# Patient Record
Sex: Female | Born: 1960 | Race: Black or African American | Hispanic: No | Marital: Married | Smoking: Never smoker
Health system: Southern US, Community
[De-identification: ages and names within clinical notes are randomized; demographics above are authoritative.]

## PROBLEM LIST (undated history)

## (undated) DIAGNOSIS — I10 Essential (primary) hypertension: Secondary | ICD-10-CM

## (undated) DIAGNOSIS — E785 Hyperlipidemia, unspecified: Secondary | ICD-10-CM

## (undated) HISTORY — PX: BRAIN SURGERY: SHX531

## (undated) HISTORY — PX: INNER EAR SURGERY: SHX679

---

## 1999-02-22 ENCOUNTER — Encounter: Payer: Self-pay | Admitting: Emergency Medicine

## 1999-02-22 ENCOUNTER — Inpatient Hospital Stay (HOSPITAL_COMMUNITY): Admission: EM | Admit: 1999-02-22 | Discharge: 1999-02-27 | Payer: Self-pay | Admitting: Emergency Medicine

## 1999-02-22 ENCOUNTER — Encounter: Payer: Self-pay | Admitting: Neurosurgery

## 1999-02-23 ENCOUNTER — Encounter: Payer: Self-pay | Admitting: Neurosurgery

## 1999-07-30 ENCOUNTER — Encounter: Payer: Self-pay | Admitting: Neurosurgery

## 1999-07-30 ENCOUNTER — Ambulatory Visit (HOSPITAL_COMMUNITY): Admission: RE | Admit: 1999-07-30 | Discharge: 1999-07-30 | Payer: Self-pay | Admitting: Neurosurgery

## 2000-02-04 ENCOUNTER — Ambulatory Visit (HOSPITAL_COMMUNITY): Admission: RE | Admit: 2000-02-04 | Discharge: 2000-02-04 | Payer: Self-pay | Admitting: Neurosurgery

## 2000-02-04 ENCOUNTER — Encounter: Payer: Self-pay | Admitting: Neurosurgery

## 2000-02-08 ENCOUNTER — Ambulatory Visit (HOSPITAL_COMMUNITY): Admission: RE | Admit: 2000-02-08 | Discharge: 2000-02-08 | Payer: Self-pay | Admitting: Neurosurgery

## 2000-02-08 ENCOUNTER — Encounter: Payer: Self-pay | Admitting: Neurosurgery

## 2000-03-24 ENCOUNTER — Encounter: Payer: Self-pay | Admitting: Neurosurgery

## 2000-03-26 ENCOUNTER — Encounter: Payer: Self-pay | Admitting: *Deleted

## 2000-03-26 ENCOUNTER — Inpatient Hospital Stay (HOSPITAL_COMMUNITY): Admission: RE | Admit: 2000-03-26 | Discharge: 2000-03-30 | Payer: Self-pay | Admitting: Neurosurgery

## 2006-11-13 ENCOUNTER — Ambulatory Visit: Payer: Self-pay | Admitting: Family Medicine

## 2006-11-20 ENCOUNTER — Ambulatory Visit: Payer: Self-pay | Admitting: Family Medicine

## 2007-12-30 ENCOUNTER — Ambulatory Visit: Payer: Self-pay | Admitting: Family Medicine

## 2011-05-20 ENCOUNTER — Emergency Department: Payer: Self-pay | Admitting: Emergency Medicine

## 2012-09-09 ENCOUNTER — Ambulatory Visit: Payer: Self-pay | Admitting: Family Medicine

## 2013-11-27 ENCOUNTER — Emergency Department: Payer: Self-pay | Admitting: Emergency Medicine

## 2014-10-24 ENCOUNTER — Ambulatory Visit: Payer: Self-pay | Admitting: Family Medicine

## 2016-05-25 ENCOUNTER — Encounter: Payer: Self-pay | Admitting: Emergency Medicine

## 2016-05-25 ENCOUNTER — Emergency Department
Admission: EM | Admit: 2016-05-25 | Discharge: 2016-05-25 | Disposition: A | Discharge status: Home / Self Care | Payer: Medicare Other | Attending: Emergency Medicine | Admitting: Emergency Medicine

## 2016-05-25 DIAGNOSIS — Y999 Unspecified external cause status: Secondary | ICD-10-CM | POA: Insufficient documentation

## 2016-05-25 DIAGNOSIS — L509 Urticaria, unspecified: Secondary | ICD-10-CM | POA: Diagnosis not present

## 2016-05-25 DIAGNOSIS — I10 Essential (primary) hypertension: Secondary | ICD-10-CM | POA: Insufficient documentation

## 2016-05-25 DIAGNOSIS — W57XXXA Bitten or stung by nonvenomous insect and other nonvenomous arthropods, initial encounter: Secondary | ICD-10-CM | POA: Insufficient documentation

## 2016-05-25 DIAGNOSIS — E785 Hyperlipidemia, unspecified: Secondary | ICD-10-CM | POA: Diagnosis not present

## 2016-05-25 DIAGNOSIS — Y929 Unspecified place or not applicable: Secondary | ICD-10-CM | POA: Diagnosis not present

## 2016-05-25 DIAGNOSIS — Y939 Activity, unspecified: Secondary | ICD-10-CM | POA: Diagnosis not present

## 2016-05-25 DIAGNOSIS — R21 Rash and other nonspecific skin eruption: Secondary | ICD-10-CM | POA: Diagnosis present

## 2016-05-25 HISTORY — DX: Essential (primary) hypertension: I10

## 2016-05-25 HISTORY — DX: Hyperlipidemia, unspecified: E78.5

## 2016-05-25 MED ORDER — FAMOTIDINE 20 MG PO TABS
40.0000 mg | ORAL_TABLET | Freq: Once | ORAL | Status: AC
  Administered 2016-05-25: 40 mg via ORAL
  Filled 2016-05-25: qty 2

## 2016-05-25 MED ORDER — PREDNISONE 20 MG PO TABS: 60.0000 mg | ORAL_TABLET | Freq: Every day | ORAL | Status: DC

## 2016-05-25 MED ORDER — HYDROXYZINE PAMOATE 25 MG PO CAPS: 25.0000 mg | ORAL_CAPSULE | Freq: Three times a day (TID) | ORAL | Status: DC | PRN

## 2016-05-25 MED ORDER — PREDNISONE 20 MG PO TABS
60.0000 mg | ORAL_TABLET | Freq: Once | ORAL | Status: AC
  Administered 2016-05-25: 60 mg via ORAL
  Filled 2016-05-25: qty 3

## 2016-05-25 MED ORDER — HYDROXYZINE HCL 25 MG PO TABS
25.0000 mg | ORAL_TABLET | Freq: Once | ORAL | Status: AC
  Administered 2016-05-25: 25 mg via ORAL
  Filled 2016-05-25: qty 1

## 2016-05-25 NOTE — ED Notes (Signed)
Pt reports on Thursday she placed cucumbers on her eyes and they became red and irritated and started itching. On Friday pt rpeorts she went to the dumpster to throw out trash and when she came back she noticed a rash on her arms and trunk. Pt has red raised rash to to both arms and abd.

## 2016-05-25 NOTE — Discharge Instructions (Signed)
Hives Hives are itchy, red, swollen areas of the skin. They can vary in size and location on your body. Hives can come and go for hours or several days (acute hives) or for several weeks (chronic hives). Hives do not spread from person to person (noncontagious). They may get worse with scratching, exercise, and emotional stress. CAUSES   Allergic reaction to food, additives, or drugs.  Infections, including the common cold.  Illness, such as vasculitis, lupus, or thyroid disease.  Exposure to sunlight, heat, or cold.  Exercise.  Stress.  Contact with chemicals. SYMPTOMS   Red or white swollen patches on the skin. The patches may change size, shape, and location quickly and repeatedly.  Itching.  Swelling of the hands, feet, and face. This may occur if hives develop deeper in the skin. DIAGNOSIS  Your caregiver can usually tell what is wrong by performing a physical exam. Skin or blood tests may also be done to determine the cause of your hives. In some cases, the cause cannot be determined. TREATMENT  Mild cases usually get better with medicines such as antihistamines. Severe cases may require an emergency epinephrine injection. If the cause of your hives is known, treatment includes avoiding that trigger.  HOME CARE INSTRUCTIONS   Avoid causes that trigger your hives.  Take antihistamines as directed by your caregiver to reduce the severity of your hives. Non-sedating or low-sedating antihistamines are usually recommended. Do not drive while taking an antihistamine.  Take any other medicines prescribed for itching as directed by your caregiver.  Wear loose-fitting clothing.  Keep all follow-up appointments as directed by your caregiver. SEEK MEDICAL CARE IF:   You have persistent or severe itching that is not relieved with medicine.  You have painful or swollen joints. SEEK IMMEDIATE MEDICAL CARE IF:   You have a fever.  Your tongue or lips are swollen.  You have  trouble breathing or swallowing.  You feel tightness in the throat or chest.  You have abdominal pain. These problems may be the first sign of a life-threatening allergic reaction. Call your local emergency services (911 in [place] MAKE SURE YOU:   Understand these instructions.  Will watch your condition.  Will get help right away if you are not doing well or get worse.   This information is not intended to replace advice given to you by your health care provider. Make sure you discuss any questions you have with your health care provider.   Document Released: 11/25/2005 Document Revised: 11/30/2013 Document Reviewed: 02/18/2012 Elsevier Interactive Patient Education 2016 Elsevier Inc.  

## 2016-05-25 NOTE — ED Provider Notes (Signed)
[facility] Emergency Department Provider Note   ____________________________________________  Time seen: Approximately 643 AM  I have reviewed the triage vital signs and the nursing notes.   HISTORY  Chief Complaint Insect Bite    HPI Alicia Chambers is a 55 y.o. female who comes into the hospital today with a rash. The patient reports that she put cucumbers on her eyes and they became red and swollen around the areas. She reports that this occurred on Thursday. She reports that she bought some eye drops yesterday and her eyes felt better. She reports though that last night she went to take her trash out to the dumpster. She reports that her window was open and she is unsure if she was bitten by anything but when she returned home she noticed that she had a rash on her arm. The patient took some Benadryl. She reports that she took a dose at midnight and then another one at 4 AM. She reports that she came in because the rash seemed to look different. The patient couldn't explain exactly how but she said it looked different and more like a rash that she decided to come in and find out what was going on. The patient reports a year ago she put cucumbers around her eyes and she did break out but she thought it was due to the skin on the cucumbers. She reports that this time she peeled the skin but she still developed this rash. The patient reports that she's never eaten cucumbers in the past.Patient denies any itching, denies any throat swelling shortness of breath or scratchiness in her throat.   Past Medical History  Diagnosis Date  . Hypertension   . Hyperlipidemia     There are no active problems to display for this patient.   History reviewed. No pertinent past surgical history.  current outpatient prescriptions: The patient does not know the names of her medications.   Allergies Review of patient's allergies indicates no known allergies.  No family  history on file.  Social History Social History  Substance Use Topics  . Smoking status: Never Smoker   . Smokeless tobacco: None  . Alcohol Use: No    Review of Systems Constitutional: No fever/chills Eyes: No visual changes. ENT: No sore throat. Cardiovascular: Denies chest pain. Respiratory: Denies shortness of breath. Gastrointestinal: No abdominal pain.  No nausea, no vomiting.  No diarrhea.  No constipation. Genitourinary: Negative for dysuria. Musculoskeletal: Negative for back pain. Skin:  rash. Neurological: Negative for headaches, focal weakness or numbness.  10-point ROS otherwise negative.  ____________________________________________   PHYSICAL EXAM:  VITAL SIGNS: ED Triage Vitals  Enc Vitals Group     BP 05/25/16 0622 157/97 mmHg     Pulse Rate 05/25/16 0622 92     Resp 05/25/16 0622 18     Temp 05/25/16 0622 99.1 F (37.3 C)     Temp Source 05/25/16 0622 Oral     SpO2 05/25/16 0622 98 %     Weight 05/25/16 0622 190 lb (86.183 kg)     Height 05/25/16 0622  (1.626 m)     Head Cir --      Peak Flow --      Pain Score 05/25/16 0623 0     Pain Loc --      Pain Edu? --      Excl. in GC? --     Constitutional: Alert and oriented. Well appearing and in Mild distress. Eyes: Conjunctivae  are normal. PERRL. EOMI. Head: Atraumatic. Nose: No congestion/rhinnorhea. Mouth/Throat: Mucous membranes are moist.  Oropharynx non-erythematous. Cardiovascular: Normal rate, regular rhythm. Grossly normal heart sounds.  Good peripheral circulation. Respiratory: Normal respiratory effort.  No retractions. Lungs CTAB. Gastrointestinal: Soft and nontender. No distention. Positive bowel sounds Musculoskeletal: No lower extremity tenderness nor edema.   Neurologic:  Normal speech and language. Skin:  Hives to the patient's bilateral arms and abdomen. Patient also has some scattered hives on her bilateral legs. Patient not itching at this moment. Psychiatric: Mood and  affect are normal.   ____________________________________________   LABS (all labs ordered are listed, but only abnormal results are displayed)  Labs Reviewed - No data to display ____________________________________________  EKG  none ____________________________________________  RADIOLOGY  none ____________________________________________   PROCEDURES  Procedure(s) performed: None  Critical Care performed: No  ____________________________________________   INITIAL IMPRESSION / ASSESSMENT AND PLAN / ED COURSE  Pertinent labs & imaging results that were available during my care of the patient were reviewed by me and considered in my medical decision making (see chart for details).  This is a 55 year old female who comes into the hospital today with hives to her bilateral arms. The patient reports that she used some cucumbers on her eyes the other day which is what seemed to cause the reaction. The patient was unsure if she may have been bitten by a bug but she does have hives all over her body. I will give the patient a dose of prednisone as well as some Pepcid and some hydroxyzine. The patient appears comfortable at this time. I will discharge her with some medication and have her follow-up with her primary care physician. The patient was concerned she may not be able to be around people which I informed her that this is not a contagious rash. The patient has no further questions or concerns. ____________________________________________   FINAL CLINICAL IMPRESSION(S) / ED DIAGNOSES  Final diagnoses:  Hives      NEW MEDICATIONS STARTED DURING THIS VISIT:  New Prescriptions   HYDROXYZINE (VISTARIL) 25 MG CAPSULE    Take 1 capsule (25 mg total) by mouth 3 (three) times daily as needed.   PREDNISONE (DELTASONE) 20 MG TABLET    Take 3 tablets (60 mg total) by mouth daily.     Note:  This document was prepared using Dragon voice recognition software and may include  unintentional dictation errors.    Rebecka ApleyAllison P Walid Haig, MD 05/25/16 [phone]

## 2016-05-31 ENCOUNTER — Encounter: Payer: Self-pay | Admitting: Emergency Medicine

## 2016-05-31 ENCOUNTER — Emergency Department
Admission: EM | Admit: 2016-05-31 | Discharge: 2016-05-31 | Disposition: A | Discharge status: Home / Self Care | Payer: Medicare Other | Attending: Emergency Medicine | Admitting: Emergency Medicine

## 2016-05-31 DIAGNOSIS — E785 Hyperlipidemia, unspecified: Secondary | ICD-10-CM | POA: Insufficient documentation

## 2016-05-31 DIAGNOSIS — I10 Essential (primary) hypertension: Secondary | ICD-10-CM | POA: Insufficient documentation

## 2016-05-31 DIAGNOSIS — L509 Urticaria, unspecified: Secondary | ICD-10-CM | POA: Insufficient documentation

## 2016-05-31 DIAGNOSIS — R21 Rash and other nonspecific skin eruption: Secondary | ICD-10-CM | POA: Diagnosis present

## 2016-05-31 MED ORDER — HYDROXYZINE PAMOATE 25 MG PO CAPS: 25.0000 mg | ORAL_CAPSULE | Freq: Three times a day (TID) | ORAL | Status: DC | PRN

## 2016-05-31 MED ORDER — HYDROXYZINE HCL 25 MG PO TABS
25.0000 mg | ORAL_TABLET | Freq: Once | ORAL | Status: AC
  Administered 2016-05-31: 25 mg via ORAL
  Filled 2016-05-31: qty 1

## 2016-05-31 NOTE — ED Notes (Addendum)
Pt was seen here last week for hives. Pt states she thinks rash stems from putting a cucumber on her eye, eye turned red, then broke out in rash. Pt D/C with prescriptions for pepcid/ prednisone, hydroxysine. Pt states she took last dose today and hives started back. Pt denies itching. States rash is on arms, little bit on stomach, and some on legs. States "I shoulda made that doctors appt" [facility] on D/C papers, but states she wants to see [place] Dr in [place].

## 2016-05-31 NOTE — ED Notes (Signed)
Patient states that she was seen here last week for hives. Patient states that she was prescribed medication and that the hives were improving. Patient states that this evening she noticed them starting to get worse again.

## 2016-05-31 NOTE — Discharge Instructions (Signed)
Hives Hives are itchy, red, swollen areas of the skin. They can vary in size and location on your body. Hives can come and go for hours or several days (acute hives) or for several weeks (chronic hives). Hives do not spread from person to person (noncontagious). They may get worse with scratching, exercise, and emotional stress. CAUSES   Allergic reaction to food, additives, or drugs.  Infections, including the common cold.  Illness, such as vasculitis, lupus, or thyroid disease.  Exposure to sunlight, heat, or cold.  Exercise.  Stress.  Contact with chemicals. SYMPTOMS   Red or white swollen patches on the skin. The patches may change size, shape, and location quickly and repeatedly.  Itching.  Swelling of the hands, feet, and face. This may occur if hives develop deeper in the skin. DIAGNOSIS  Your caregiver can usually tell what is wrong by performing a physical exam. Skin or blood tests may also be done to determine the cause of your hives. In some cases, the cause cannot be determined. TREATMENT  Mild cases usually get better with medicines such as antihistamines. Severe cases may require an emergency epinephrine injection. If the cause of your hives is known, treatment includes avoiding that trigger.  HOME CARE INSTRUCTIONS   Avoid causes that trigger your hives.  Take antihistamines as directed by your caregiver to reduce the severity of your hives. Non-sedating or low-sedating antihistamines are usually recommended. Do not drive while taking an antihistamine.  Take any other medicines prescribed for itching as directed by your caregiver.  Wear loose-fitting clothing.  Keep all follow-up appointments as directed by your caregiver. SEEK MEDICAL CARE IF:   You have persistent or severe itching that is not relieved with medicine.  You have painful or swollen joints. SEEK IMMEDIATE MEDICAL CARE IF:   You have a fever.  Your tongue or lips are swollen.  You have  trouble breathing or swallowing.  You feel tightness in the throat or chest.  You have abdominal pain. These problems may be the first sign of a life-threatening allergic reaction. Call your local emergency services (911 in [place] MAKE SURE YOU:   Understand these instructions.  Will watch your condition.  Will get help right away if you are not doing well or get worse.   This information is not intended to replace advice given to you by your health care provider. Make sure you discuss any questions you have with your health care provider.   Document Released: 11/25/2005 Document Revised: 11/30/2013 Document Reviewed: 02/18/2012 Elsevier Interactive Patient Education [employer]2016 Elsevier Inc.  Please take hydroxyzine as prescribed, avoid hot showers.

## 2016-05-31 NOTE — ED Provider Notes (Signed)
CSN: [id number]     Arrival date & time 05/31/16  2039 History   First MD Initiated Contact with Patient 05/31/16 2053     Chief Complaint  Patient presents with  . Urticaria     (Consider location/radiation/quality/duration/timing/severity/associated sxs/prior Treatment) HPI  55 year old female presents to emergency department for evaluation of skin rash to left and right upper arms. Mild rash to the abdomen. Rash is mildly pruritic. Rash began 1 hour ago. She denies any new contacts to soaps, detergents or fabrics. No new foods. She denies any facial swelling chest pain shortness of breath. Patient was seen 6 days ago diagnosed with hives, given steroids and Vistaril which helped significantly. She denies any tick bites joint aches, fevers. Patient states she has been extremely stressed. She is currently not taking any medications for her rash.  Past Medical History  Diagnosis Date  . Hypertension   . Hyperlipidemia    Past Surgical History  Procedure Laterality Date  . Brain surgery    . Inner ear surgery     No family history on file. Social History  Substance Use Topics  . Smoking status: Never Smoker   . Smokeless tobacco: None  . Alcohol Use: No   OB History    No data available     Review of Systems  Constitutional: Negative for fever, chills, activity change and fatigue.  HENT: Negative for congestion, sinus pressure and sore throat.   Eyes: Negative for visual disturbance.  Respiratory: Negative for cough, chest tightness and shortness of breath.   Cardiovascular: Negative for chest pain and leg swelling.  Gastrointestinal: Negative for nausea, vomiting, abdominal pain and diarrhea.  Genitourinary: Negative for dysuria.  Musculoskeletal: Negative for arthralgias and gait problem.  Skin: Positive for rash.  Neurological: Negative for weakness, numbness and headaches.  Hematological: Negative for adenopathy.  Psychiatric/Behavioral: Negative for behavioral  problems, confusion and agitation.      Allergies  Cucumber extract  Home Medications   Prior to Admission medications   Medication Sig Start Date End Date Taking? Authorizing Provider  hydrOXYzine (VISTARIL) 25 MG capsule Take 1 capsule (25 mg total) by mouth 3 (three) times daily as needed. 05/31/16   Evon Slackhomas C Gaines, PA-C  predniSONE (DELTASONE) 20 MG tablet Take 3 tablets (60 mg total) by mouth daily. 05/25/16   Rebecka ApleyAllison P Webster, MD   BP 154/85 mmHg  Pulse 83  Temp(Src) 98 F (36.7 C)  Resp 18  Ht 5\' 4"  (1.626 m)  Wt 86.183 kg  BMI 32.60 kg/m2  SpO2 100% Physical Exam  Constitutional: She is oriented to person, place, and time. She appears well-developed and well-nourished. No distress.  HENT:  Head: Normocephalic and atraumatic.  Right Ear: External ear normal.  Left Ear: External ear normal.  Nose: Nose normal.  Mouth/Throat: Oropharynx is clear and moist. No oropharyngeal exudate.  No facial edema  Eyes: EOM are normal. Pupils are equal, round, and reactive to light. Right eye exhibits no discharge. Left eye exhibits no discharge.  Neck: Normal range of motion. Neck supple.  Cardiovascular: Normal rate, regular rhythm and intact distal pulses.   Pulmonary/Chest: Effort normal and breath sounds normal. No respiratory distress. She has no wheezes. She has no rales. She exhibits no tenderness.  Abdominal: Soft. She exhibits no distension. There is no tenderness.  Musculoskeletal: Normal range of motion. She exhibits no edema.  Neurological: She is alert and oriented to person, place, and time. She has normal reflexes.  Skin: Skin is warm  and dry. Rash (patient with urticarial type rash with raised macular erythematous lesions along the upper arms mostly, minimal erythema to the abdomen. No open sores or lesions. No warmth.) noted.  Psychiatric: She has a normal mood and affect. Her behavior is normal. Thought content normal.    ED Course  Procedures (including critical  care time) Labs Review Labs Reviewed - No data to display  Imaging Review No results found. I have personally reviewed and evaluated these images and lab results as part of my medical decision-making.   EKG Interpretation None      MDM   Final diagnoses:  Urticaria    55 year old female with continued rash, no signs of allergic reaction. Rash consistent with hives/urticaria. She will continue with Vistaril, was given a prescription for this today. She will follow-up with PCP. No signs of tick exposure or tick born illness. She is educated on signs and symptoms to return to the ED for.  Evon Slackhomas C Gaines, PA-C 05/31/16 2110  Jeanmarie PlantJames A McShane, MD 05/31/16 2300

## 2016-06-19 DIAGNOSIS — E785 Hyperlipidemia, unspecified: Secondary | ICD-10-CM | POA: Insufficient documentation

## 2016-06-19 DIAGNOSIS — I10 Essential (primary) hypertension: Secondary | ICD-10-CM | POA: Insufficient documentation

## 2016-06-19 DIAGNOSIS — R21 Rash and other nonspecific skin eruption: Secondary | ICD-10-CM | POA: Diagnosis not present

## 2016-06-19 DIAGNOSIS — Z7952 Long term (current) use of systemic steroids: Secondary | ICD-10-CM | POA: Diagnosis not present

## 2016-06-20 ENCOUNTER — Encounter: Payer: Self-pay | Admitting: *Deleted

## 2016-06-20 ENCOUNTER — Emergency Department
Admission: EM | Admit: 2016-06-20 | Discharge: 2016-06-20 | Disposition: A | Discharge status: Home / Self Care | Payer: Medicare Other | Attending: Emergency Medicine | Admitting: Emergency Medicine

## 2016-06-20 DIAGNOSIS — R21 Rash and other nonspecific skin eruption: Secondary | ICD-10-CM

## 2016-06-20 NOTE — ED Notes (Signed)
Pt to ED via POV requesting to see an MD for a second opinion. 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 Pt states "I just don't know I want a blood test to confirm it and see another doctor because I just don't believe it" Pt tearful in triage, vitals stable, NAD noted.

## 2016-06-20 NOTE — ED Notes (Signed)
Pt discharged to home.  Discharge instructions reviewed.  Verbalized understanding.  No questions or concerns at this time.  Teach back verified.  Pt in NAD.  No items left in ED.   

## 2016-06-20 NOTE — Discharge Instructions (Signed)
As we discussed you might benefit from seeing a dermatologist. Please discuss this with your doctor. Please seek medical attention for any high fevers, chest pain, shortness of breath, change in behavior, persistent vomiting, bloody stool or any other new or concerning symptoms.

## 2016-06-20 NOTE — ED Provider Notes (Signed)
[facility] Emergency Department Provider Note   ____________________________________________  Time seen: ~0250  I have reviewed the triage vital signs and the nursing notes.   HISTORY  Chief Complaint Rash   History limited by: Not Limited   HPI Stephany Poorman is a 55 y.o. female who presents to the emergency department today because of desire to get a second opinion on a rash. The rash is located at the top of her intergluteal cleft. She denies that this is painful. States she has never had pain there. Her primary care doctor today told her that he thought it was likely herpes.   Past Medical History  Diagnosis Date  . Hypertension   . Hyperlipidemia     There are no active problems to display for this patient.   Past Surgical History  Procedure Laterality Date  . Brain surgery    . Inner ear surgery      Current Outpatient Rx  Name  Route  Sig  Dispense  Refill  . hydrOXYzine (VISTARIL) 25 MG capsule   Oral   Take 1 capsule (25 mg total) by mouth 3 (three) times daily as needed.   30 capsule   0   . predniSONE (DELTASONE) 20 MG tablet   Oral   Take 3 tablets (60 mg total) by mouth daily.   12 tablet   0     Allergies Cucumber extract  History reviewed. No pertinent family history.  Social History Social History  Substance Use Topics  . Smoking status: Never Smoker   . Smokeless tobacco: None  . Alcohol Use: No    Review of Systems  Constitutional: Negative for fever. Cardiovascular: Negative for chest pain. Respiratory: Negative for shortness of breath. Gastrointestinal: Negative for abdominal pain, vomiting and diarrhea. Neurological: Negative for headaches, focal weakness or numbness.  10-point ROS otherwise negative.  ____________________________________________   PHYSICAL EXAM:  VITAL SIGNS: ED Triage Vitals  Enc Vitals Group     BP 06/20/16 0014 160/90 mmHg     Pulse --      Resp 06/20/16 0014 18     Temp 06/20/16 0014 98.6 F (37 C)     Temp Source 06/20/16 0014 Oral     SpO2 06/20/16 0014 98 %     Weight 06/20/16 0014 191 lb (86.637 kg)     Height 06/20/16 0014  (1.626 m)     Head Cir --      Peak Flow --      Pain Score 06/20/16 0015 0   Constitutional: Alert and oriented. Well appearing and in no distress. Eyes: Conjunctivae are normal. PERRL. Normal extraocular movements. ENT   Head: Normocephalic and atraumatic.   Nose: No congestion/rhinnorhea.   Mouth/Throat: Mucous membranes are moist.   Neck: No stridor. Gastrointestinal: Soft and nontender. No distention.  Genitourinary: Deferred Musculoskeletal: Normal range of motion in all extremities. No joint effusions.  No lower extremity tenderness nor edema. Neurologic:  Normal speech and language. No gross focal neurologic deficits are appreciated.  Skin:  Skin is warm, dry and intact. A couple small vesicles noted at the time of her intergluteal fold. No surrounding erythema. Nontender to palpation. Psychiatric: Mood and affect are normal. Speech and behavior are normal. Patient exhibits appropriate insight and judgment.  ____________________________________________    LABS (pertinent positives/negatives)  None  ____________________________________________   EKG  None  ____________________________________________    RADIOLOGY  None  ____________________________________________   PROCEDURES  Procedure(s) performed: None  Critical Care  performed: No  ____________________________________________   INITIAL IMPRESSION / ASSESSMENT AND PLAN / ED COURSE  Pertinent labs & imaging results that were available during my care of the patient were reviewed by me and considered in my medical decision making (see chart for details).  Patient presented to the emergency department today because of desire to get a second opinion of a rash in her intergluteal fold. On exam she did have a  comfortable small vascular type lesions the top of her intergluteal fold however didn't appreciate any surrounding erythema and it was nontender to palpation. This point I would have a hard time: Herpes given that it is completely pain-free. Did discuss with patient that she would likely benefit from dermatology referral. He states that she will talk to her primary care doctor about obtaining dermatology referral.  ____________________________________________   FINAL CLINICAL IMPRESSION(S) / ED DIAGNOSES  Final diagnoses:  Rash     Note: This dictation was prepared with Dragon dictation. Any transcriptional errors that result from this process are unintentional    Phineas SemenGraydon Mare Ludtke, MD 06/20/16 [phone]

## 2017-03-03 ENCOUNTER — Other Ambulatory Visit: Payer: Self-pay | Admitting: Family Medicine

## 2017-03-03 DIAGNOSIS — Z1231 Encounter for screening mammogram for malignant neoplasm of breast: Secondary | ICD-10-CM

## 2017-03-31 ENCOUNTER — Ambulatory Visit
Admission: RE | Admit: 2017-03-31 | Discharge: 2017-03-31 | Disposition: A | Discharge status: Home / Self Care | Payer: Medicare Other | Source: Ambulatory Visit | Attending: Family Medicine | Admitting: Family Medicine

## 2017-03-31 DIAGNOSIS — Z1231 Encounter for screening mammogram for malignant neoplasm of breast: Secondary | ICD-10-CM

## 2017-06-23 IMAGING — MG MM DIGITAL SCREENING BILAT W/ CAD
5 series · 5 of 5 positions shown · non-contrast
Comparison: Previous exam(s).

CLINICAL DATA: Screening.

EXAM:
DIGITAL SCREENING BILATERAL MAMMOGRAM WITH CAD

[L MLO]
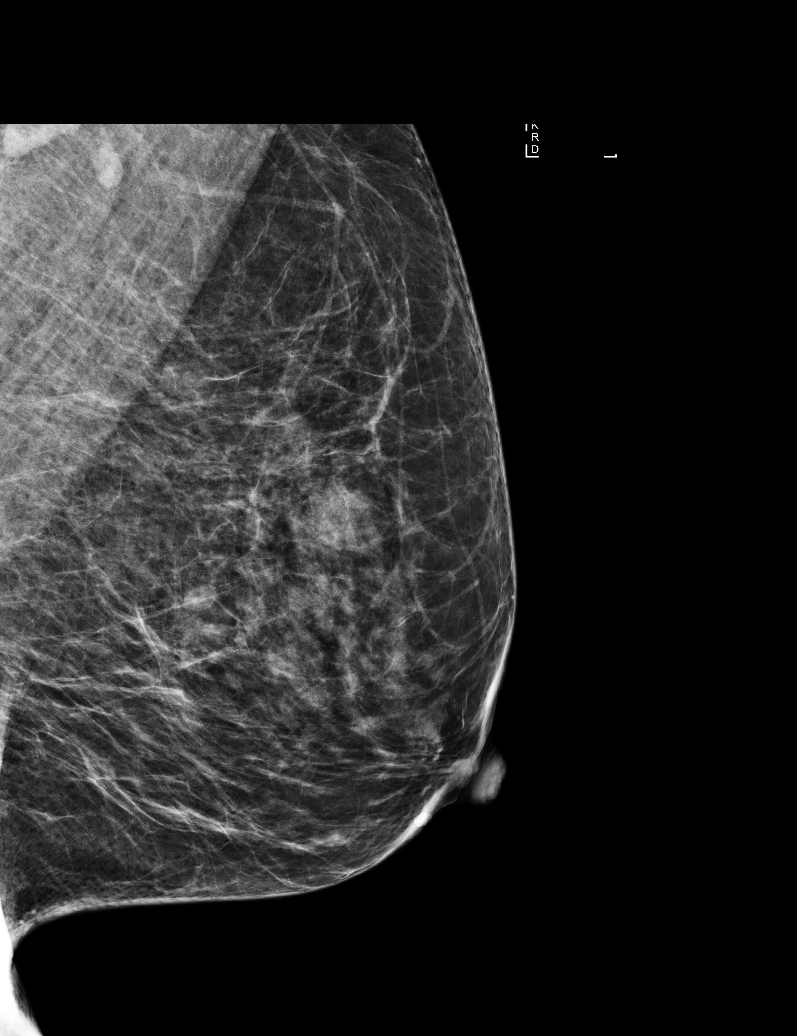

[R MLO]
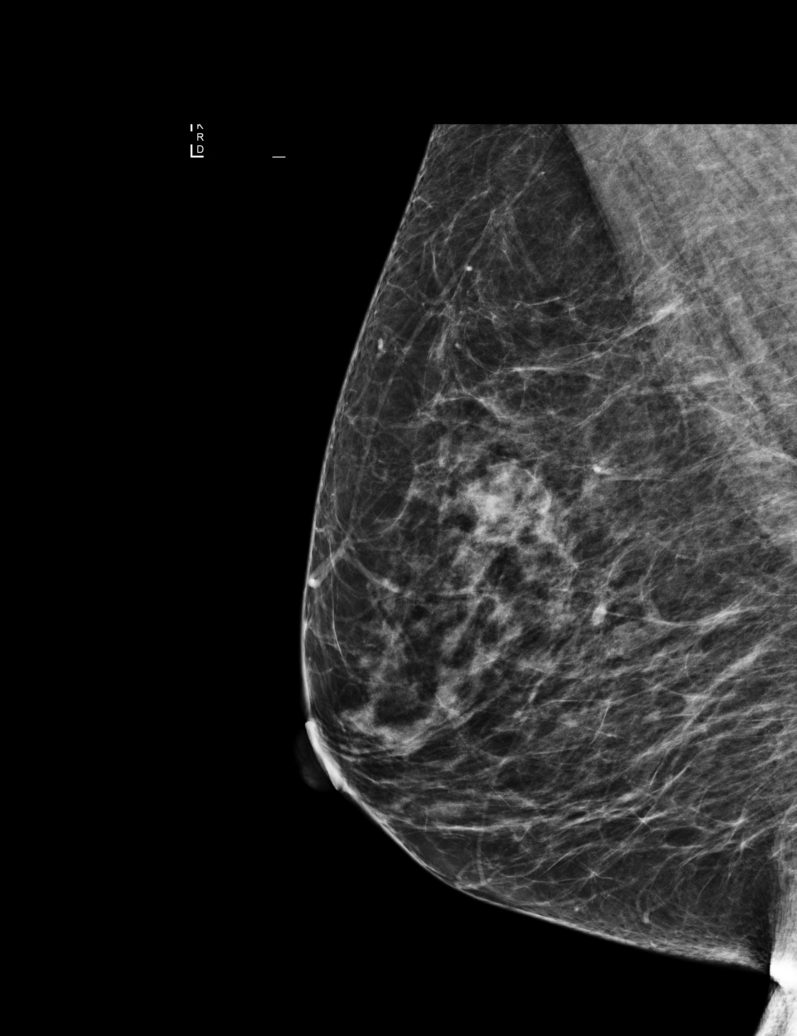

[R CC (1 of 2)]
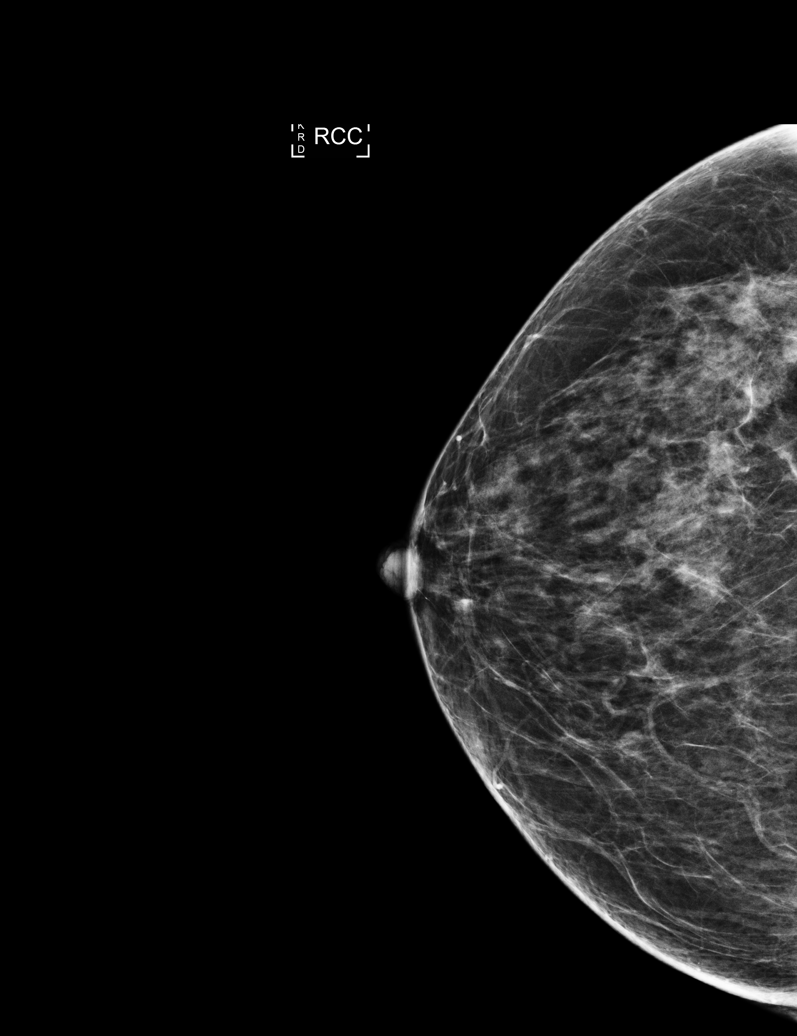

[L CC]
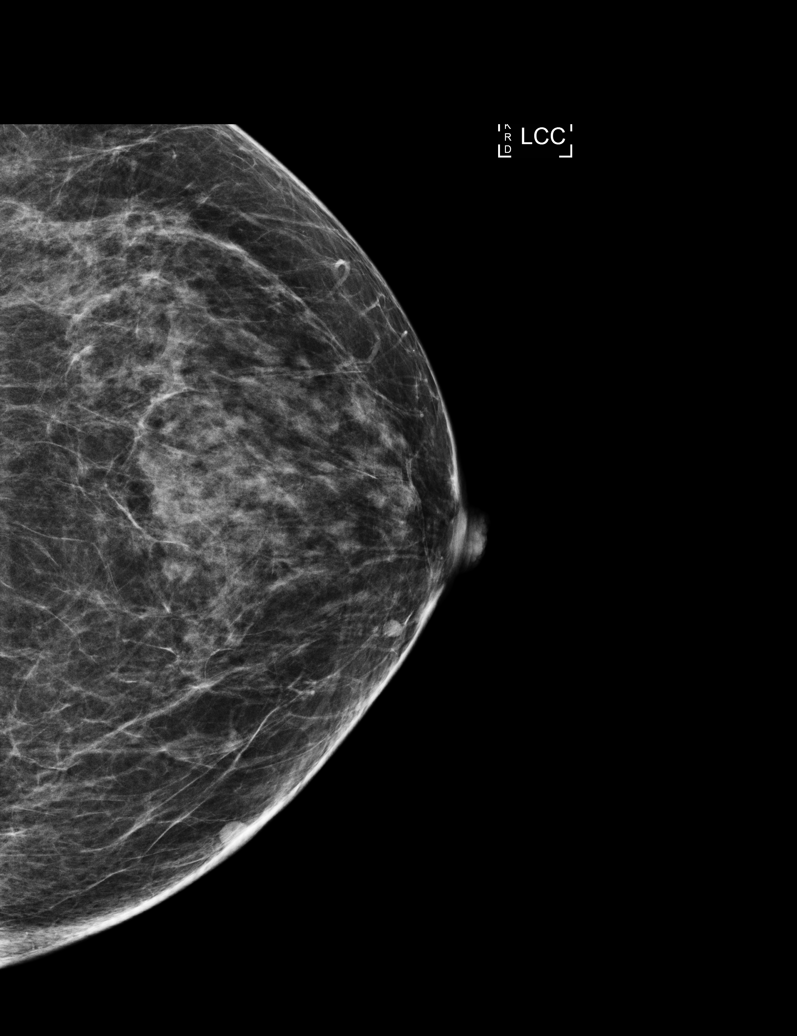

[R CC (2 of 2)]
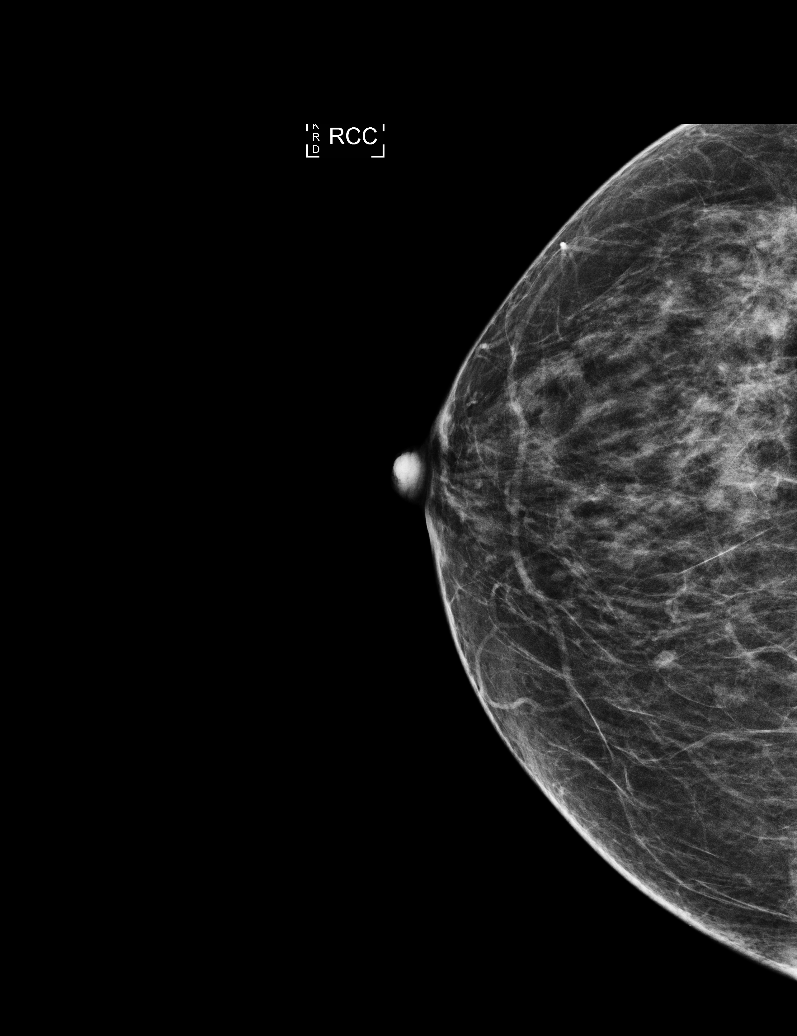

[5 of 5 positions shown; findings below may reference images not displayed]

ACR Breast Density Category b: There are scattered areas of
fibroglandular density.
FINDINGS: There are no findings suspicious for malignancy. Images were
processed with CAD.
IMPRESSION: No mammographic evidence of malignancy. A result letter of this
screening mammogram will be mailed directly to the patient.

RECOMMENDATION:
Screening mammogram in one year. (Code:AS-G-LCT)

BI-RADS CATEGORY  1: Negative.

## 2017-06-24 ENCOUNTER — Encounter: Payer: Self-pay | Admitting: Emergency Medicine

## 2017-06-24 ENCOUNTER — Emergency Department
Admission: EM | Admit: 2017-06-24 | Discharge: 2017-06-25 | Disposition: A | Discharge status: Home / Self Care | Payer: Medicare Other | Attending: Emergency Medicine | Admitting: Emergency Medicine

## 2017-06-24 DIAGNOSIS — R112 Nausea with vomiting, unspecified: Secondary | ICD-10-CM | POA: Diagnosis not present

## 2017-06-24 DIAGNOSIS — K529 Noninfective gastroenteritis and colitis, unspecified: Secondary | ICD-10-CM | POA: Insufficient documentation

## 2017-06-24 DIAGNOSIS — R109 Unspecified abdominal pain: Secondary | ICD-10-CM | POA: Diagnosis present

## 2017-06-24 DIAGNOSIS — R1010 Upper abdominal pain, unspecified: Secondary | ICD-10-CM | POA: Insufficient documentation

## 2017-06-24 DIAGNOSIS — K5900 Constipation, unspecified: Secondary | ICD-10-CM | POA: Diagnosis not present

## 2017-06-24 DIAGNOSIS — Z79899 Other long term (current) drug therapy: Secondary | ICD-10-CM | POA: Diagnosis not present

## 2017-06-24 DIAGNOSIS — I10 Essential (primary) hypertension: Secondary | ICD-10-CM | POA: Diagnosis not present

## 2017-06-24 LAB — TROPONIN I: Troponin I: <0.03 ng/mL (ref <0.03)

## 2017-06-24 LAB — COMPREHENSIVE METABOLIC PANEL
ALT: 30 U/L (ref 14–54)
ANION GAP: 11 (ref 5–15)
AST: 41 U/L (ref 15–41)
Albumin: 4.7 g/dL (ref 3.5–5.0)
Alkaline Phosphatase: 98 U/L (ref 38–126)
BUN: 13 mg/dL (ref 6–20)
CO2: 25 mmol/L (ref 22–32)
Calcium: 10.6 mg/dL — ABNORMAL HIGH (ref 8.9–10.3)
Chloride: 100 mmol/L — ABNORMAL LOW (ref 101–111)
Creatinine, Ser: 1.13 mg/dL — ABNORMAL HIGH (ref 0.44–1.00)
GFR calc Af Amer: >60 mL/min (ref >60)
GFR calc non Af Amer: 53 mL/min — ABNORMAL LOW (ref >60)
GLUCOSE: 119 mg/dL — AB (ref 65–99)
POTASSIUM: 3.4 mmol/L — AB (ref 3.5–5.1)
SODIUM: 136 mmol/L (ref 135–145)
Total Bilirubin: 0.8 mg/dL (ref 0.3–1.2)
Total Protein: 9.5 g/dL — ABNORMAL HIGH (ref 6.5–8.1)

## 2017-06-24 LAB — CBC
HEMATOCRIT: 43.3 % (ref 35.0–47.0)
HEMOGLOBIN: 14.6 g/dL (ref 12.0–16.0)
MCH: 27.5 pg (ref 26.0–34.0)
MCHC: 33.7 g/dL (ref 32.0–36.0)
MCV: 81.7 fL (ref 80.0–100.0)
Platelets: 319 10*3/uL (ref 150–440)
RBC: 5.31 MIL/uL — ABNORMAL HIGH (ref 3.80–5.20)
RDW: 14.4 % (ref 11.5–14.5)
WBC: 10 10*3/uL (ref 3.6–11.0)

## 2017-06-24 LAB — LIPASE, BLOOD: Lipase: 45 U/L (ref 11–51)

## 2017-06-24 MED ORDER — IOPAMIDOL (ISOVUE-300) INJECTION 61%
30.0000 mL | Freq: Once | INTRAVENOUS | Status: AC | PRN
  Administered 2017-06-24: 30 mL via ORAL

## 2017-06-24 MED ORDER — ONDANSETRON HCL 4 MG/2ML IJ SOLN
4.0000 mg | Freq: Once | INTRAMUSCULAR | Status: AC
  Administered 2017-06-24: 4 mg via INTRAVENOUS
  Filled 2017-06-24: qty 2

## 2017-06-24 MED ORDER — SODIUM CHLORIDE 0.9 % IV BOLUS (SEPSIS)
1000.0000 mL | Freq: Once | INTRAVENOUS | Status: AC
  Administered 2017-06-24: 1000 mL via INTRAVENOUS

## 2017-06-24 NOTE — ED Triage Notes (Signed)
Pt ambulatory to triage with steady gait and guarding stomach. Pt c/o upper abdominal pain since Saturday, worsening today. Pt reports she has not had a BM since Saturday and has tried prune juice and an enema. Pt having continued emesis in triage. Pt taken to [facility].

## 2017-06-25 ENCOUNTER — Emergency Department: Payer: Medicare Other

## 2017-06-25 ENCOUNTER — Encounter: Payer: Self-pay | Admitting: Radiology

## 2017-06-25 LAB — URINALYSIS, COMPLETE (UACMP) WITH MICROSCOPIC
Bacteria, UA: NONE SEEN
Bilirubin Urine: NEGATIVE
GLUCOSE, UA: NEGATIVE mg/dL
Ketones, ur: 5 mg/dL — AB
Leukocytes, UA: NEGATIVE
Nitrite: NEGATIVE
Protein, ur: NEGATIVE mg/dL
Specific Gravity, Urine: >1.046 — ABNORMAL HIGH (ref 1.005–1.030)
pH: 5 (ref 5.0–8.0)

## 2017-06-25 LAB — LACTIC ACID, PLASMA: Lactic Acid, Venous: 1.1 mmol/L (ref 0.5–1.9)

## 2017-06-25 MED ORDER — CIPROFLOXACIN HCL 500 MG PO TABS: 500.0000 mg | ORAL_TABLET | Freq: Two times a day (BID) | ORAL | 0 refills | Status: AC

## 2017-06-25 MED ORDER — CIPROFLOXACIN IN D5W 400 MG/200ML IV SOLN
400.0000 mg | Freq: Once | INTRAVENOUS | Status: AC
  Administered 2017-06-25: 400 mg via INTRAVENOUS
  Filled 2017-06-25: qty 200

## 2017-06-25 MED ORDER — METRONIDAZOLE 500 MG PO TABS
500.0000 mg | ORAL_TABLET | Freq: Once | ORAL | Status: AC
  Administered 2017-06-25: 500 mg via ORAL
  Filled 2017-06-25: qty 1

## 2017-06-25 MED ORDER — METOCLOPRAMIDE HCL 5 MG/ML IJ SOLN
10.0000 mg | Freq: Once | INTRAMUSCULAR | Status: AC
  Administered 2017-06-25: 10 mg via INTRAVENOUS
  Filled 2017-06-25: qty 2

## 2017-06-25 MED ORDER — METRONIDAZOLE 500 MG PO TABS: 500.0000 mg | ORAL_TABLET | Freq: Two times a day (BID) | ORAL | 0 refills | Status: AC

## 2017-06-25 MED ORDER — METOCLOPRAMIDE HCL 10 MG PO TABS: 10.0000 mg | ORAL_TABLET | Freq: Three times a day (TID) | ORAL | 0 refills | Status: AC | PRN

## 2017-06-25 MED ORDER — IOPAMIDOL (ISOVUE-300) INJECTION 61%
100.0000 mL | Freq: Once | INTRAVENOUS | Status: AC | PRN
  Administered 2017-06-25: 100 mL via INTRAVENOUS

## 2017-06-25 MED ORDER — POLYETHYLENE GLYCOL 3350 17 G PO PACK: 17.0000 g | PACK | Freq: Every day | ORAL | 0 refills | Status: AC

## 2017-06-25 NOTE — ED Provider Notes (Signed)
[facility] Emergency Department Provider Note   ____________________________________________   First MD Initiated Contact with Patient 06/24/17 2327     (approximate)  I have reviewed the triage vital signs and the nursing notes.   HISTORY  Chief Complaint Abdominal Pain    HPI Alicia Chambers is a 56 y.o. female who comes into the hospital today with some dull achy epigastric pain. She reports that she has not had a bowel movement since Saturday and today she started having this abdominal pain. She tried taking soup crackers and prune juice but it didn't help. The patient vomited 3. She also tried an enema with no success. The patient had some palpitations but reports her abdominal pain is currently improved. She does have a history of constipation but has had no fevers no headache no cold symptoms and chest pain or shortness of breath no fatigue or weakness. The patient reports that she just wants to have a bowel movement. She reports her emesis looked like what she ate and water. The patient reports that she has not been able to keep down fluids. Currently her pain is a 3 out of 10 in intensity. She is here today for evaluation of her symptoms.   Past Medical History:  Diagnosis Date  . Hyperlipidemia   . Hypertension     There are no active problems to display for this patient.   Past Surgical History:  Procedure Laterality Date  . BRAIN SURGERY    . INNER EAR SURGERY      Prior to Admission medications   Medication Sig Start Date End Date Taking? Authorizing Provider  atorvastatin (LIPITOR) 20 MG tablet Take 20 mg by mouth daily.   Yes [provider]  enalapril (VASOTEC) 10 MG tablet Take 10 mg by mouth daily.   Yes [provider]  ferrous sulfate 325 (65 FE) MG tablet Take 325 mg by mouth daily with breakfast.   Yes [provider]  hydrochlorothiazide (MICROZIDE) 12.5 MG capsule Take 12.5 mg by mouth daily.    Yes [provider]  valACYclovir (VALTREX) 500 MG tablet Take 500 mg by mouth daily.   Yes [provider]  ciprofloxacin (CIPRO) 500 MG tablet Take 1 tablet (500 mg total) by mouth 2 (two) times daily. 06/25/17 07/02/17  Rebecka ApleyWebster, Mickala Laton P, MD  metoCLOPramide (REGLAN) 10 MG tablet Take 1 tablet (10 mg total) by mouth every 8 (eight) hours as needed. 06/25/17   Rebecka ApleyWebster, Lashanta Elbe P, MD  metroNIDAZOLE (FLAGYL) 500 MG tablet Take 1 tablet (500 mg total) by mouth 2 (two) times daily. 06/25/17 07/02/17  Rebecka ApleyWebster, Piercen Covino P, MD  polyethylene glycol [facility](MIRALAX) packet Take 17 g by mouth daily. 06/25/17   Rebecka ApleyWebster, Marchia Diguglielmo P, MD    Allergies Cucumber extract  Family History  Problem Relation Age of Onset  . Breast cancer Neg Hx     Social History Social History  Substance Use Topics  . Smoking status: Never Smoker  . Smokeless tobacco: Never Used  . Alcohol use No    Review of Systems  Constitutional: No fever/chills Eyes: No visual changes. ENT: No sore throat. Cardiovascular: palpiatations Respiratory: Denies shortness of breath. Gastrointestinal: abdominal pain, nausea, vomiting. No constipation. Genitourinary: Negative for dysuria. Musculoskeletal: Negative for back pain. Skin: Negative for rash. Neurological: Negative for headaches, focal weakness or numbness.   ____________________________________________   PHYSICAL EXAM:  VITAL SIGNS: ED Triage Vitals  Enc Vitals Group     BP 06/24/17 2300 (!) 152/91  Pulse Rate 06/24/17 2300 91     Resp 06/24/17 2300 (!) 24     Temp 06/24/17 2300 98.3 F (36.8 C)     Temp Source 06/24/17 2300 Oral     SpO2 06/24/17 2300 98 %     Weight 06/24/17 2244 191 lb (86.6 kg)     Height --      Head Circumference --      Peak Flow --      Pain Score 06/24/17 2256 3     Pain Loc --      Pain Edu? --      Excl. in GC? --     Constitutional: Alert and oriented. Well appearing and in mild distress. Eyes: Conjunctivae are  normal. PERRL. EOMI. Head: Atraumatic. Nose: No congestion/rhinnorhea. Mouth/Throat: Mucous membranes are moist.  Oropharynx non-erythematous. Cardiovascular: Normal rate, regular rhythm. Grossly normal heart sounds.  Good peripheral circulation. Respiratory: Normal respiratory effort.  No retractions. Lungs CTAB. Gastrointestinal: Soft with mild epigastric and LUQ tenderness to palpation. No distention. Positive bowel sounds eval Musculoskeletal: No lower extremity tenderness nor edema.   Neurologic:  Normal speech and language. Skin:  Skin is warm, dry and intact.  Psychiatric: Mood and affect are normal.   ____________________________________________   LABS (all labs ordered are listed, but only abnormal results are displayed)  Labs Reviewed  COMPREHENSIVE METABOLIC PANEL - Abnormal; Notable for the following:       Result Value   Potassium 3.4 (*)    Chloride 100 (*)    Glucose, Bld 119 (*)    Creatinine, Ser 1.13 (*)    Calcium 10.6 (*)    Total Protein 9.5 (*)    GFR calc non Af Amer 53 (*)    All other components within normal limits  CBC - Abnormal; Notable for the following:    RBC 5.31 (*)    All other components within normal limits  URINALYSIS, COMPLETE (UACMP) WITH MICROSCOPIC - Abnormal; Notable for the following:    Color, Urine YELLOW (*)    APPearance CLEAR (*)    Specific Gravity, Urine >1.046 (*)    Hgb urine dipstick MODERATE (*)    Ketones, ur 5 (*)    Squamous Epithelial / LPF 0-5 (*)    All other components within normal limits  LIPASE, BLOOD  TROPONIN I  LACTIC ACID, PLASMA   ____________________________________________  EKG  ED ECG REPORT I, Rebecka Apley, the attending physician, personally viewed and interpreted this ECG.   Date: 06/24/2017  EKG Time: 2259  Rate: 89  Rhythm: normal sinus rhythm  Axis: normal  Intervals:none  ST&T Change: ST depression II, III, avf, V4, V5,  V6  ____________________________________________  RADIOLOGY  Ct Abdomen Pelvis W Contrast  Result Date: 06/25/2017 CLINICAL DATA:  Upper abdominal pain, vomiting and constipation. EXAM: CT ABDOMEN AND PELVIS WITH CONTRAST TECHNIQUE: Multidetector CT imaging of the abdomen and pelvis was performed using the standard protocol following bolus administration of intravenous contrast. CONTRAST:  ISOVUE-300 IOPAMIDOL (ISOVUE-300) INJECTION 61% COMPARISON:  None. FINDINGS: Lower chest: No pulmonary nodules. No visible pleural or pericardial effusion. Hepatobiliary: Normal hepatic size and contours without focal liver lesion. No perihepatic ascites. No intra- or extrahepatic biliary dilatation. Normal gallbladder. Pancreas: Normal pancreatic contours and enhancement. No peripancreatic fluid collection or pancreatic ductal dilatation. Spleen: Normal. Adrenals/Urinary Tract: Normal adrenal glands. No hydronephrosis or solid renal mass. Stomach/Bowel: There is a small volume of colonic stool. The small bowel is diffusely fluid-filled, but  not pathologically dilated. There is no acute inflammatory change of the small bowel or colonic walls. There is short segment narrowing of the terminal ileum downstream of the dilated bowel. No associated terminal ileal inflammation. The There is a small amount of fluid in the pelvis within the cul-de-sac. The appendix is normal. Vascular/Lymphatic: Normal course and caliber of the major abdominal vessels. No abdominal or pelvic adenopathy. Reproductive: Lobulated uterus, likely multiple fibroids. No adnexal mass. Small amount of fluid in the cul-de-sac. Musculoskeletal: No bony spinal canal stenosis. Multilevel lower lumbar facet arthrosis. Likely sebaceous cyst in the right lower quadrant subcutaneous fat of the anterior abdomen. Other: No contributory non-categorized findings. IMPRESSION: 1. Diffusely fluid-filled small bowel proximal to the terminal ileum which is narrowed  along a short segment without associated inflammatory change. This appearance may be due to transient peristaltic contraction, though a stricture could have the same appearance. 2. Fibroid uterus. Electronically Signed   By: Deatra Robinson M.D.   On: 06/25/2017 01:19    ____________________________________________   PROCEDURES  Procedure(s) performed: None  Procedures  Critical Care performed: No  ____________________________________________   INITIAL IMPRESSION / ASSESSMENT AND PLAN / ED COURSE  Pertinent labs & imaging results that were available during my care of the patient were reviewed by me and considered in my medical decision making (see chart for details).  This is a 56 year old female who comes into the hospital today with some upper abdominal pain. The patient also reports that she's been having some constipation nausea and vomiting. I did send the patient for a CT scan and give her some normal saline and Zofran. The patient did have some vomiting after the contrast but her CT showed some fluid-filled small bowel proximal to the terminal ileum I did give the patient some ciprofloxacin and a dose of Flagyl. Also give the patient some Reglan since she did have vomiting earlier. I will reassess the patient when she's received her medications.  Clinical Course as of Jun 26 439  Wed Jun 25, 2017  0056 CT Abdomen Pelvis W Contrast [MJ]    Clinical Course User Index [MJ] Maurice Small, Student-PA   I did give the patient the antibiotics and the Reglan. The patient was able to take her MiraLAX without any vomiting. When I did reassess the patient she reports that she feels much better. I will discharge the patient home. I will have her follow-up with her primary care physician. The patient has no further questions or concerns at this time. She'll be discharged home.  ____________________________________________   FINAL CLINICAL IMPRESSION(S) / ED DIAGNOSES  Final  diagnoses:  Pain of upper abdomen  Enteritis  Non-intractable vomiting with nausea, unspecified vomiting type  Constipation, unspecified constipation type      NEW MEDICATIONS STARTED DURING THIS VISIT:  Discharge Medication List as of 06/25/2017  4:00 AM    START taking these medications   Details  ciprofloxacin (CIPRO) 500 MG tablet Take 1 tablet (500 mg total) by mouth 2 (two) times daily., Starting Wed 06/25/2017, Until Wed 07/02/2017, Print    metoCLOPramide (REGLAN) 10 MG tablet Take 1 tablet (10 mg total) by mouth every 8 (eight) hours as needed., Starting Wed 06/25/2017, Print    metroNIDAZOLE (FLAGYL) 500 MG tablet Take 1 tablet (500 mg total) by mouth 2 (two) times daily., Starting Wed 06/25/2017, Until Wed 07/02/2017, Print    polyethylene glycol (MIRALAX) packet Take 17 g by mouth daily., Starting Wed 06/25/2017, Print  Note:  This document was prepared using Dragon voice recognition software and may include unintentional dictation errors.    Rebecka Apley, MD 06/25/17 [phone]

## 2017-06-25 NOTE — Discharge Instructions (Signed)
Please follow up with your primary care physician and GI. Please return with any worsened condition.

## 2017-06-25 NOTE — ED Notes (Signed)
CT called and notified pt finished contrast

## 2017-06-25 NOTE — ED Notes (Signed)
Pt resting comfortably. Respirations even and unlabored.  

## 2019-05-14 ENCOUNTER — Other Ambulatory Visit: Payer: Self-pay | Admitting: Family Medicine

## 2019-05-14 DIAGNOSIS — Z1231 Encounter for screening mammogram for malignant neoplasm of breast: Secondary | ICD-10-CM

## 2019-06-25 ENCOUNTER — Ambulatory Visit
Admission: RE | Admit: 2019-06-25 | Discharge: 2019-06-25 | Disposition: A | Discharge status: Home / Self Care | Payer: Medicare HMO | Source: Ambulatory Visit | Attending: Family Medicine | Admitting: Family Medicine

## 2019-06-25 ENCOUNTER — Other Ambulatory Visit: Payer: Self-pay

## 2019-06-25 DIAGNOSIS — Z1231 Encounter for screening mammogram for malignant neoplasm of breast: Secondary | ICD-10-CM | POA: Diagnosis not present

## 2021-07-17 ENCOUNTER — Other Ambulatory Visit: Payer: Self-pay | Admitting: Family Medicine

## 2021-07-17 DIAGNOSIS — Z1231 Encounter for screening mammogram for malignant neoplasm of breast: Secondary | ICD-10-CM

## 2021-07-24 ENCOUNTER — Other Ambulatory Visit: Payer: Self-pay

## 2021-07-24 ENCOUNTER — Ambulatory Visit
Admission: RE | Admit: 2021-07-24 | Discharge: 2021-07-24 | Disposition: A | Discharge status: Home / Self Care | Payer: Medicare HMO | Source: Ambulatory Visit | Attending: Family Medicine | Admitting: Family Medicine

## 2021-07-24 DIAGNOSIS — Z1231 Encounter for screening mammogram for malignant neoplasm of breast: Secondary | ICD-10-CM | POA: Insufficient documentation

## 2021-08-08 ENCOUNTER — Other Ambulatory Visit: Payer: Self-pay | Admitting: Family Medicine

## 2021-08-08 DIAGNOSIS — N6310 Unspecified lump in the right breast, unspecified quadrant: Secondary | ICD-10-CM

## 2021-08-08 DIAGNOSIS — R928 Other abnormal and inconclusive findings on diagnostic imaging of breast: Secondary | ICD-10-CM

## 2021-08-14 ENCOUNTER — Ambulatory Visit
Admission: RE | Admit: 2021-08-14 | Discharge: 2021-08-14 | Disposition: A | Discharge status: Home / Self Care | Payer: Medicare HMO | Source: Ambulatory Visit | Attending: Family Medicine | Admitting: Family Medicine

## 2021-08-14 ENCOUNTER — Other Ambulatory Visit: Payer: Self-pay

## 2021-08-14 DIAGNOSIS — N6310 Unspecified lump in the right breast, unspecified quadrant: Secondary | ICD-10-CM | POA: Diagnosis present

## 2021-08-14 DIAGNOSIS — R928 Other abnormal and inconclusive findings on diagnostic imaging of breast: Secondary | ICD-10-CM

## 2023-01-07 ENCOUNTER — Encounter: Payer: Self-pay | Admitting: Family Medicine

## 2023-01-09 ENCOUNTER — Other Ambulatory Visit: Payer: Self-pay | Admitting: Family Medicine

## 2023-01-09 DIAGNOSIS — Z1231 Encounter for screening mammogram for malignant neoplasm of breast: Secondary | ICD-10-CM

## 2023-01-15 ENCOUNTER — Ambulatory Visit
Admission: RE | Admit: 2023-01-15 | Discharge: 2023-01-15 | Disposition: A | Discharge status: Home / Self Care | Payer: Medicare HMO | Source: Ambulatory Visit | Attending: Family Medicine | Admitting: Family Medicine

## 2023-01-15 DIAGNOSIS — Z1231 Encounter for screening mammogram for malignant neoplasm of breast: Secondary | ICD-10-CM | POA: Diagnosis present

## 2024-07-26 ENCOUNTER — Other Ambulatory Visit: Payer: Self-pay | Admitting: Family Medicine

## 2024-07-26 DIAGNOSIS — Z1231 Encounter for screening mammogram for malignant neoplasm of breast: Secondary | ICD-10-CM

## 2024-08-18 ENCOUNTER — Ambulatory Visit
Admission: RE | Admit: 2024-08-18 | Discharge: 2024-08-18 | Disposition: A | Discharge status: Home / Self Care | Source: Ambulatory Visit | Attending: Family Medicine | Admitting: Family Medicine

## 2024-08-18 DIAGNOSIS — Z1231 Encounter for screening mammogram for malignant neoplasm of breast: Secondary | ICD-10-CM | POA: Diagnosis present
